# Patient Record
Sex: Male | Born: 1990 | Race: Black or African American | Hispanic: No | Marital: Single | Smoking: Current every day smoker
Health system: Southern US, Community
[De-identification: ages and names within clinical notes are randomized; demographics above are authoritative.]

## PROBLEM LIST (undated history)

## (undated) DIAGNOSIS — W3400XA Accidental discharge from unspecified firearms or gun, initial encounter: Secondary | ICD-10-CM

---
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
## 2015-03-30 ENCOUNTER — Emergency Department (HOSPITAL_COMMUNITY)
Admission: EM | Admit: 2015-03-30 | Discharge: 2015-03-30 | Disposition: A | Discharge status: Home / Self Care | Payer: Self-pay | Attending: Emergency Medicine | Admitting: Emergency Medicine

## 2015-03-30 ENCOUNTER — Emergency Department (HOSPITAL_COMMUNITY): Payer: Self-pay

## 2015-03-30 ENCOUNTER — Encounter (HOSPITAL_COMMUNITY): Payer: Self-pay | Admitting: Emergency Medicine

## 2015-03-30 DIAGNOSIS — S81801A Unspecified open wound, right lower leg, initial encounter: Secondary | ICD-10-CM | POA: Insufficient documentation

## 2015-03-30 DIAGNOSIS — S81802A Unspecified open wound, left lower leg, initial encounter: Secondary | ICD-10-CM | POA: Insufficient documentation

## 2015-03-30 DIAGNOSIS — Y9389 Activity, other specified: Secondary | ICD-10-CM | POA: Insufficient documentation

## 2015-03-30 DIAGNOSIS — Y998 Other external cause status: Secondary | ICD-10-CM | POA: Insufficient documentation

## 2015-03-30 DIAGNOSIS — T1490XA Injury, unspecified, initial encounter: Secondary | ICD-10-CM

## 2015-03-30 DIAGNOSIS — Y9289 Other specified places as the place of occurrence of the external cause: Secondary | ICD-10-CM | POA: Insufficient documentation

## 2015-03-30 DIAGNOSIS — W3400XA Accidental discharge from unspecified firearms or gun, initial encounter: Secondary | ICD-10-CM | POA: Insufficient documentation

## 2015-03-30 DIAGNOSIS — Z23 Encounter for immunization: Secondary | ICD-10-CM | POA: Insufficient documentation

## 2015-03-30 LAB — COMPREHENSIVE METABOLIC PANEL
ALK PHOS: 63 U/L (ref 38–126)
ALT: 12 U/L — AB (ref 17–63)
AST: 24 U/L (ref 15–41)
Albumin: 4.6 g/dL (ref 3.5–5.0)
Anion gap: 20 — ABNORMAL HIGH (ref 5–15)
BILIRUBIN TOTAL: 0.4 mg/dL (ref 0.3–1.2)
BUN: 6 mg/dL (ref 6–20)
CALCIUM: 9.4 mg/dL (ref 8.9–10.3)
CO2: 18 mmol/L — AB (ref 22–32)
CREATININE: 1.08 mg/dL (ref 0.61–1.24)
Chloride: 105 mmol/L (ref 101–111)
Glucose, Bld: 91 mg/dL (ref 65–99)
Potassium: 3.7 mmol/L (ref 3.5–5.1)
SODIUM: 143 mmol/L (ref 135–145)
TOTAL PROTEIN: 7.3 g/dL (ref 6.5–8.1)

## 2015-03-30 LAB — CBC
HCT: 44.6 % (ref 39.0–52.0)
Hemoglobin: 14.9 g/dL (ref 13.0–17.0)
MCH: 28 pg (ref 26.0–34.0)
MCHC: 33.4 g/dL (ref 30.0–36.0)
MCV: 83.7 fL (ref 78.0–100.0)
PLATELETS: 238 10*3/uL (ref 150–400)
RBC: 5.33 MIL/uL (ref 4.22–5.81)
RDW: 13.1 % (ref 11.5–15.5)
WBC: 10.8 10*3/uL — ABNORMAL HIGH (ref 4.0–10.5)

## 2015-03-30 LAB — PROTIME-INR
INR: 1.09 (ref 0.00–1.49)
PROTHROMBIN TIME: 14.3 s (ref 11.6–15.2)

## 2015-03-30 LAB — SAMPLE TO BLOOD BANK

## 2015-03-30 LAB — CDS SEROLOGY

## 2015-03-30 LAB — ETHANOL: ALCOHOL ETHYL (B): 6 mg/dL — AB (ref <5)

## 2015-03-30 MED ORDER — MORPHINE SULFATE (PF) 4 MG/ML IV SOLN
4.0000 mg | Freq: Once | INTRAVENOUS | Status: AC
  Administered 2015-03-30: 4 mg via INTRAVENOUS
  Filled 2015-03-30: qty 1

## 2015-03-30 MED ORDER — TETANUS-DIPHTH-ACELL PERTUSSIS 5-2.5-18.5 LF-MCG/0.5 IM SUSP
0.5000 mL | Freq: Once | INTRAMUSCULAR | Status: AC
  Administered 2015-03-30: 0.5 mL via INTRAMUSCULAR
  Filled 2015-03-30: qty 0.5

## 2015-03-30 MED ORDER — IOHEXOL 350 MG/ML SOLN
100.0000 mL | Freq: Once | INTRAVENOUS | Status: AC | PRN
  Administered 2015-03-30: 100 mL via INTRAVENOUS

## 2015-03-30 MED ORDER — CEPHALEXIN 500 MG PO CAPS
500.0000 mg | ORAL_CAPSULE | Freq: Four times a day (QID) | ORAL | Status: AC
Start: 2015-03-30 — End: ?

## 2015-03-30 MED ORDER — HYDROCODONE-ACETAMINOPHEN 5-325 MG PO TABS: 1.0000 | ORAL_TABLET | Freq: Four times a day (QID) | ORAL | Status: AC | PRN
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
## 2015-03-30 NOTE — ED Notes (Signed)
Patient left at this time with all belongings. Provided crutches.

## 2015-04-05 ENCOUNTER — Emergency Department (HOSPITAL_COMMUNITY)
Admission: EM | Admit: 2015-04-05 | Discharge: 2015-04-05 | Disposition: A | Discharge status: Home / Self Care | Payer: Self-pay | Attending: Emergency Medicine | Admitting: Emergency Medicine

## 2015-04-05 ENCOUNTER — Encounter (HOSPITAL_COMMUNITY): Payer: Self-pay | Admitting: *Deleted

## 2015-04-05 DIAGNOSIS — Z87828 Personal history of other (healed) physical injury and trauma: Secondary | ICD-10-CM | POA: Insufficient documentation

## 2015-04-05 DIAGNOSIS — M795 Residual foreign body in soft tissue: Secondary | ICD-10-CM

## 2015-04-05 DIAGNOSIS — Z792 Long term (current) use of antibiotics: Secondary | ICD-10-CM | POA: Insufficient documentation

## 2015-04-05 DIAGNOSIS — Z1889 Other specified retained foreign body fragments: Secondary | ICD-10-CM | POA: Insufficient documentation

## 2015-04-05 MED ORDER — LIDOCAINE HCL 2 % IJ SOLN: 20.0000 mL | Freq: Once | INTRAMUSCULAR | Status: DC

## 2015-04-05 MED ORDER — LIDOCAINE-EPINEPHRINE (PF) 2 %-1:200000 IJ SOLN
20.0000 mL | Freq: Once | INTRAMUSCULAR | Status: DC
  Filled 2015-04-05: qty 20
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
## 2015-04-05 NOTE — Discharge Instructions (Signed)
Please continue antibiotics as directed. Please follow up in the emergency room in 3 days for re check. Please return sooner if any sings of infection present.
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
## 2015-04-08 ENCOUNTER — Emergency Department (HOSPITAL_COMMUNITY)
Admission: EM | Admit: 2015-04-08 | Discharge: 2015-04-08 | Disposition: A | Discharge status: Home / Self Care | Payer: Self-pay | Attending: Emergency Medicine | Admitting: Emergency Medicine

## 2015-04-08 ENCOUNTER — Encounter (HOSPITAL_COMMUNITY): Payer: Self-pay | Admitting: *Deleted

## 2015-04-08 DIAGNOSIS — Z792 Long term (current) use of antibiotics: Secondary | ICD-10-CM | POA: Insufficient documentation

## 2015-04-08 DIAGNOSIS — F17210 Nicotine dependence, cigarettes, uncomplicated: Secondary | ICD-10-CM | POA: Insufficient documentation

## 2015-04-08 DIAGNOSIS — Z4801 Encounter for change or removal of surgical wound dressing: Secondary | ICD-10-CM | POA: Insufficient documentation

## 2015-04-08 DIAGNOSIS — Z87828 Personal history of other (healed) physical injury and trauma: Secondary | ICD-10-CM | POA: Insufficient documentation

## 2015-04-08 DIAGNOSIS — Z5189 Encounter for other specified aftercare: Secondary | ICD-10-CM

## 2015-04-08 HISTORY — DX: Accidental discharge from unspecified firearms or gun, initial encounter: W34.00XA
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
## 2018-11-30 ENCOUNTER — Other Ambulatory Visit: Payer: Self-pay

## 2018-11-30 ENCOUNTER — Emergency Department (HOSPITAL_COMMUNITY)
Admission: EM | Admit: 2018-11-30 | Discharge: 2018-11-30 | Disposition: A | Discharge status: Home / Self Care | Payer: No Typology Code available for payment source | Attending: Emergency Medicine | Admitting: Emergency Medicine

## 2018-11-30 ENCOUNTER — Emergency Department (HOSPITAL_COMMUNITY): Payer: No Typology Code available for payment source

## 2018-11-30 ENCOUNTER — Encounter (HOSPITAL_COMMUNITY): Payer: Self-pay | Admitting: Emergency Medicine

## 2018-11-30 DIAGNOSIS — F17210 Nicotine dependence, cigarettes, uncomplicated: Secondary | ICD-10-CM | POA: Insufficient documentation

## 2018-11-30 DIAGNOSIS — R0789 Other chest pain: Secondary | ICD-10-CM | POA: Insufficient documentation

## 2018-11-30 DIAGNOSIS — Z79899 Other long term (current) drug therapy: Secondary | ICD-10-CM | POA: Insufficient documentation

## 2018-11-30 DIAGNOSIS — M791 Myalgia, unspecified site: Secondary | ICD-10-CM | POA: Diagnosis not present

## 2018-11-30 MED ORDER — IBUPROFEN 400 MG PO TABS
600.0000 mg | ORAL_TABLET | Freq: Once | ORAL | Status: AC
  Administered 2018-11-30: 600 mg via ORAL
  Filled 2018-11-30: qty 1

## 2018-11-30 MED ORDER — ACETAMINOPHEN 500 MG PO TABS: 500.0000 mg | ORAL_TABLET | Freq: Four times a day (QID) | ORAL | 0 refills | Status: AC | PRN

## 2018-11-30 MED ORDER — CYCLOBENZAPRINE HCL 10 MG PO TABS
10.0000 mg | ORAL_TABLET | Freq: Once | ORAL | Status: AC
  Administered 2018-11-30: 10 mg via ORAL
  Filled 2018-11-30: qty 1

## 2018-11-30 MED ORDER — LIDOCAINE 4 % EX CREA: 1.0000 "application " | TOPICAL_CREAM | Freq: Three times a day (TID) | CUTANEOUS | 0 refills | Status: AC | PRN

## 2018-11-30 MED ORDER — IBUPROFEN 600 MG PO TABS: 600.0000 mg | ORAL_TABLET | Freq: Four times a day (QID) | ORAL | 0 refills | Status: AC | PRN

## 2018-11-30 MED ORDER — CYCLOBENZAPRINE HCL 10 MG PO TABS: 10.0000 mg | ORAL_TABLET | Freq: Two times a day (BID) | ORAL | 0 refills | Status: AC | PRN
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
## 2018-11-30 NOTE — ED Notes (Signed)
Patient verbalizes understanding of discharge instructions . Opportunity for questions and answers were provided . Armband removed by staff ,Pt discharged from ED. W/C  offered at D/C  and Declined W/C at D/C and was escorted to lobby by RN.  
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
## 2018-11-30 NOTE — Discharge Instructions (Signed)
Your x-ray today did not show any sign of broken bones or lung injury.  Alternate 600 mg of ibuprofen and [phone] mg of Tylenol every 3-4 hours as needed for pain.  Take ibuprofen with food to avoid upset stomach issues.  Do not exceed 4000 mg of Tylenol daily.  You can apply lidocaine cream to areas of pain as prescribed.  You may take Flexeril up to twice daily as needed for muscle spasms. This medication may make you drowsy, so I typically only recommended only taking at night. If this medication makes you drowsy throughout the day, no driving, drinking alcohol, or operating heavy machinery. You may also cut these tablets in half if they are too strong.   Apply ice or heat, whichever feels best, to areas of soreness 20 minutes at a time 3-4 times daily.  Do some gentle stretching throughout the day, especially during or after hot showers or baths. Take short frequent walks and avoid prolonged periods of sitting or laying.   Expect to be sore for the next few day and follow up with primary care physician for recheck of ongoing symptoms (especially if symptoms persist longer than 1 week) but return to ER for emergent changing or worsening of symptoms such as severe headache that gets worse, altered mental status/behaving unusually, persistent vomiting, excessive drowsiness, numbness to the arms or legs, unsteady gait, or slurred speech.

## 2020-12-04 ENCOUNTER — Other Ambulatory Visit: Payer: Self-pay

## 2020-12-04 ENCOUNTER — Emergency Department (HOSPITAL_COMMUNITY)
Admission: EM | Admit: 2020-12-04 | Discharge: 2020-12-04 | Disposition: A | Discharge status: Home / Self Care | Payer: Self-pay | Attending: Emergency Medicine | Admitting: Emergency Medicine

## 2020-12-04 ENCOUNTER — Emergency Department (HOSPITAL_COMMUNITY): Payer: Self-pay

## 2020-12-04 DIAGNOSIS — Z20822 Contact with and (suspected) exposure to covid-19: Secondary | ICD-10-CM | POA: Insufficient documentation

## 2020-12-04 DIAGNOSIS — R55 Syncope and collapse: Secondary | ICD-10-CM | POA: Insufficient documentation

## 2020-12-04 DIAGNOSIS — K047 Periapical abscess without sinus: Secondary | ICD-10-CM | POA: Insufficient documentation

## 2020-12-04 DIAGNOSIS — F17210 Nicotine dependence, cigarettes, uncomplicated: Secondary | ICD-10-CM | POA: Insufficient documentation

## 2020-12-04 LAB — BASIC METABOLIC PANEL
Anion gap: 5 (ref 5–15)
BUN: 8 mg/dL (ref 6–20)
CO2: 29 mmol/L (ref 22–32)
Calcium: 8.9 mg/dL (ref 8.9–10.3)
Chloride: 103 mmol/L (ref 98–111)
Creatinine, Ser: 1.04 mg/dL (ref 0.61–1.24)
GFR, Estimated: >60 mL/min (ref >60)
Glucose, Bld: 108 mg/dL — ABNORMAL HIGH (ref 70–99)
Potassium: 3.7 mmol/L (ref 3.5–5.1)
Sodium: 137 mmol/L (ref 135–145)

## 2020-12-04 LAB — CBG MONITORING, ED: Glucose-Capillary: 113 mg/dL — ABNORMAL HIGH (ref 70–99)

## 2020-12-04 LAB — CBC
HCT: 43 % (ref 39.0–52.0)
Hemoglobin: 14.2 g/dL (ref 13.0–17.0)
MCH: 27.9 pg (ref 26.0–34.0)
MCHC: 33 g/dL (ref 30.0–36.0)
MCV: 84.5 fL (ref 80.0–100.0)
Platelets: 269 10*3/uL (ref 150–400)
RBC: 5.09 MIL/uL (ref 4.22–5.81)
RDW: 13.2 % (ref 11.5–15.5)
WBC: 7.6 10*3/uL (ref 4.0–10.5)
nRBC: 0 % (ref 0.0–0.2)

## 2020-12-04 LAB — RESP PANEL BY RT-PCR (FLU A&B, COVID) ARPGX2
Influenza A by PCR: NEGATIVE
Influenza B by PCR: NEGATIVE
SARS Coronavirus 2 by RT PCR: NEGATIVE

## 2020-12-04 MED ORDER — AMOXICILLIN-POT CLAVULANATE 875-125 MG PO TABS: 1.0000 | ORAL_TABLET | Freq: Two times a day (BID) | ORAL | 0 refills | Status: AC

## 2020-12-04 MED ORDER — AMOXICILLIN-POT CLAVULANATE 875-125 MG PO TABS
1.0000 | ORAL_TABLET | Freq: Once | ORAL | Status: AC
  Administered 2020-12-04: 1 via ORAL
  Filled 2020-12-04: qty 1

## 2020-12-04 MED ORDER — SODIUM CHLORIDE 0.9 % IV BOLUS
1000.0000 mL | Freq: Once | INTRAVENOUS | Status: AC
  Administered 2020-12-04: 1000 mL via INTRAVENOUS
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
## 2020-12-04 NOTE — Discharge Instructions (Addendum)
We did not find anything concerning on labs or imaging that were done today in the emergency department. Please return to the ED if you develop recurrent or worsening symptoms.   I have provided you dental resources. Please set up an appointment for your infected tooth. I have also provided you with information to establish care at [facility] so that you can establish care.   Please take all of your antibiotics until finished!   You may develop abdominal discomfort or diarrhea from the antibiotic.  You may help offset this with probiotics which you can buy or get in yogurt. Do not eat  or take the probiotics until 2 hours after your antibiotic.

## 2022-11-08 ENCOUNTER — Encounter (HOSPITAL_COMMUNITY): Payer: Self-pay | Admitting: Emergency Medicine

## 2022-11-08 DIAGNOSIS — W3400XA Accidental discharge from unspecified firearms or gun, initial encounter: Secondary | ICD-10-CM | POA: Insufficient documentation

## 2022-12-01 DEATH — deceased

## 2023-08-26 IMAGING — CR DG CHEST 2V
3 series · 3 of 3 positions shown · non-contrast
Comparison: November 30, 2018

CLINICAL DATA: Syncope.

EXAM:
CHEST - 2 VIEW

[w chest lat]
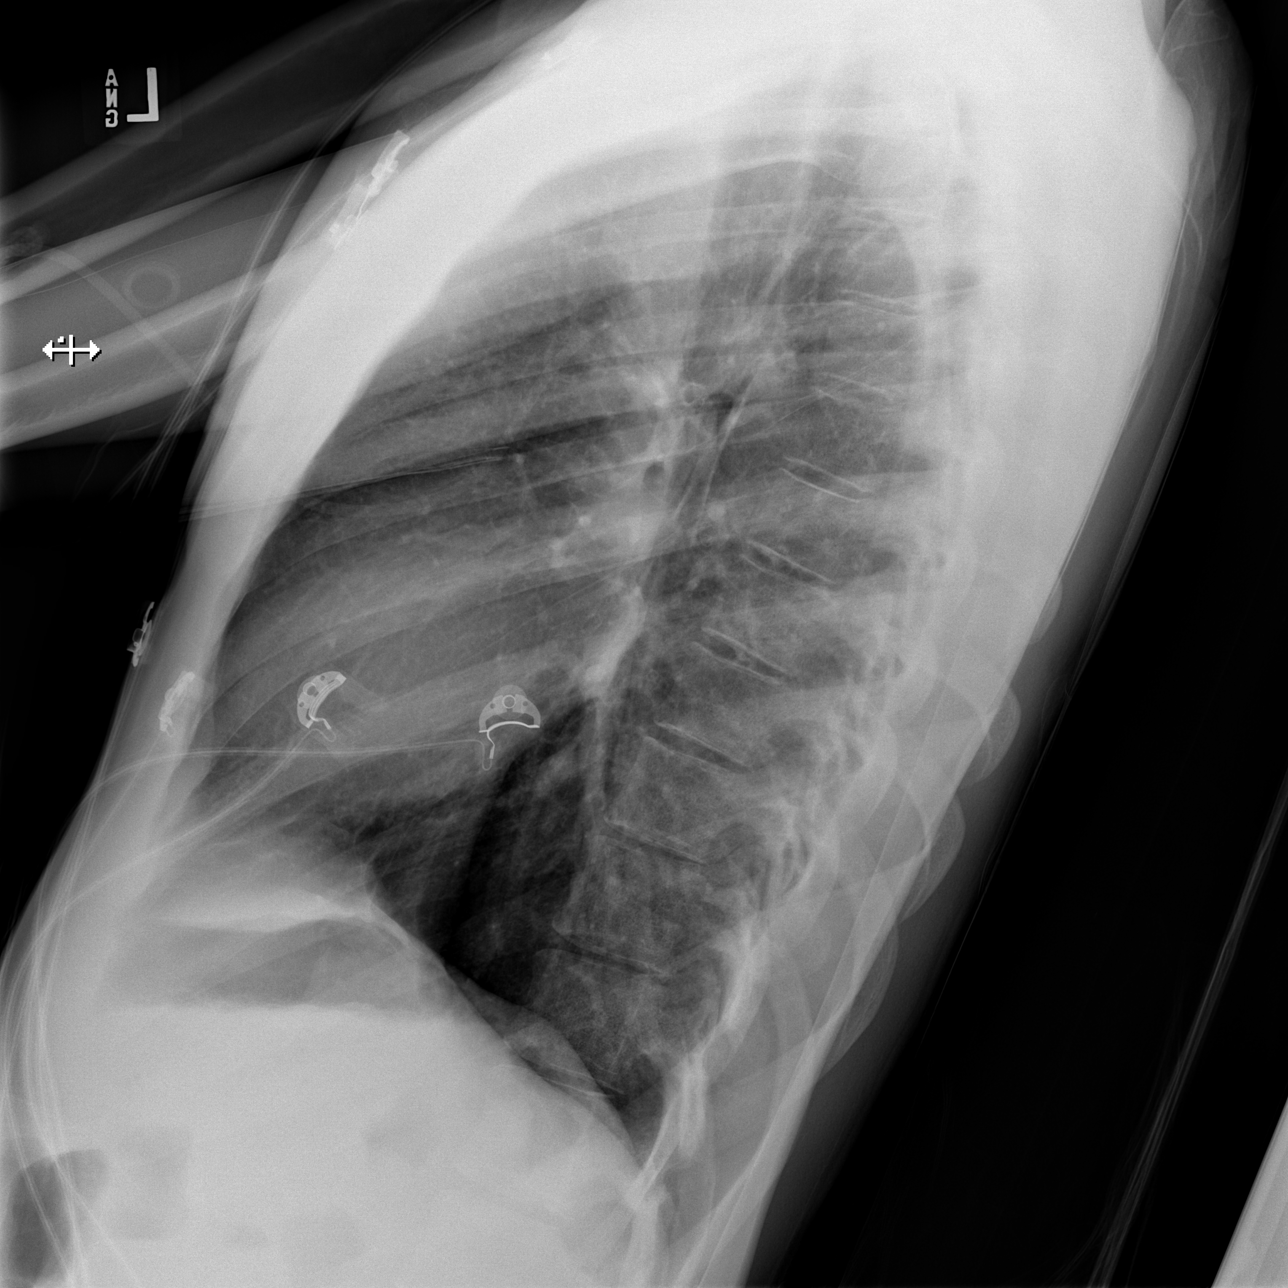

[x chest ap (1 of 2)]
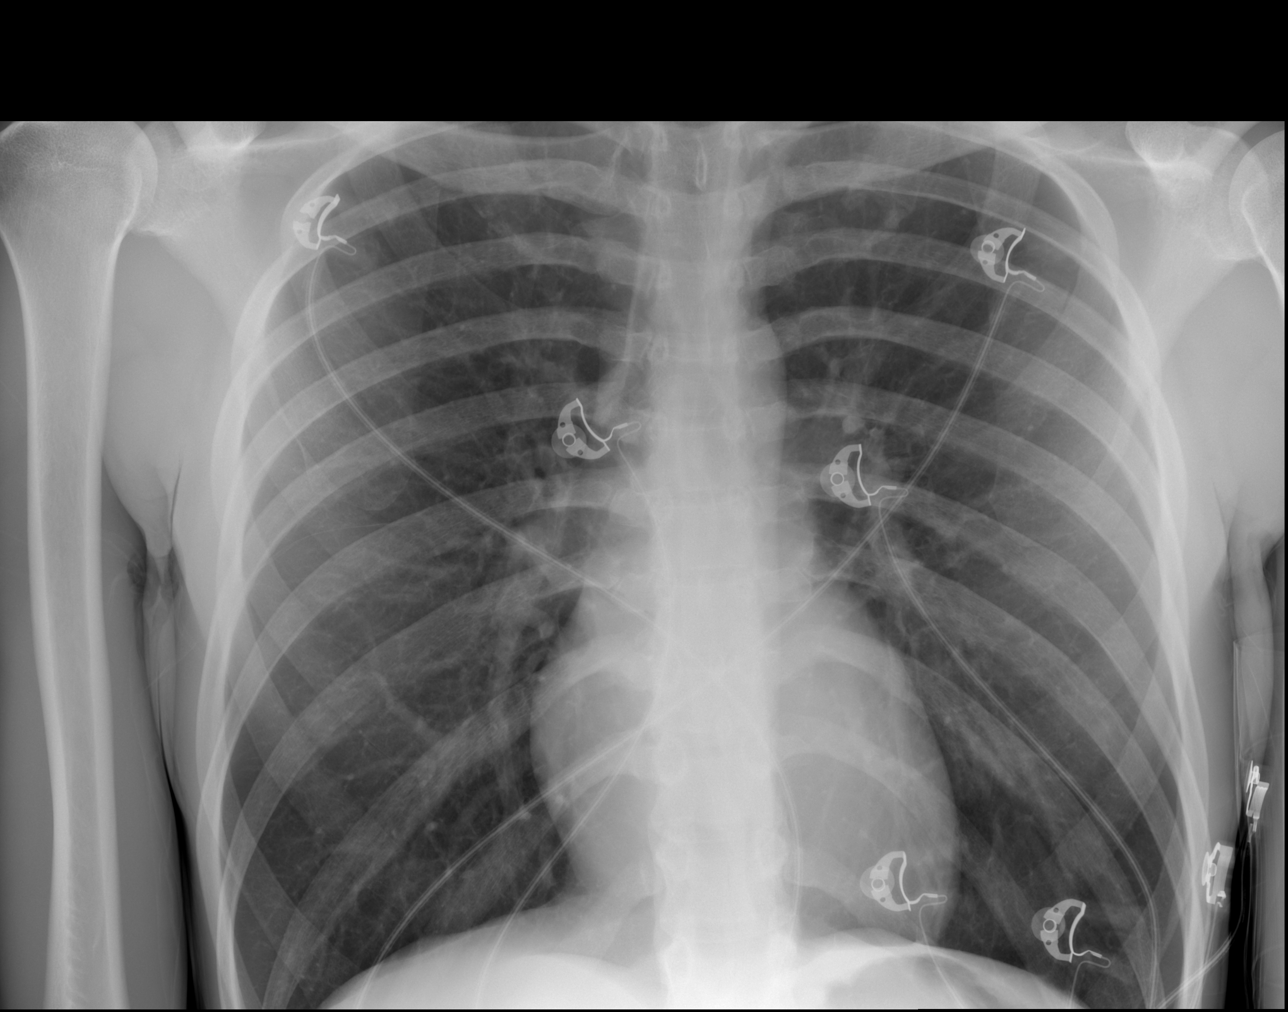

[x chest ap (2 of 2)]
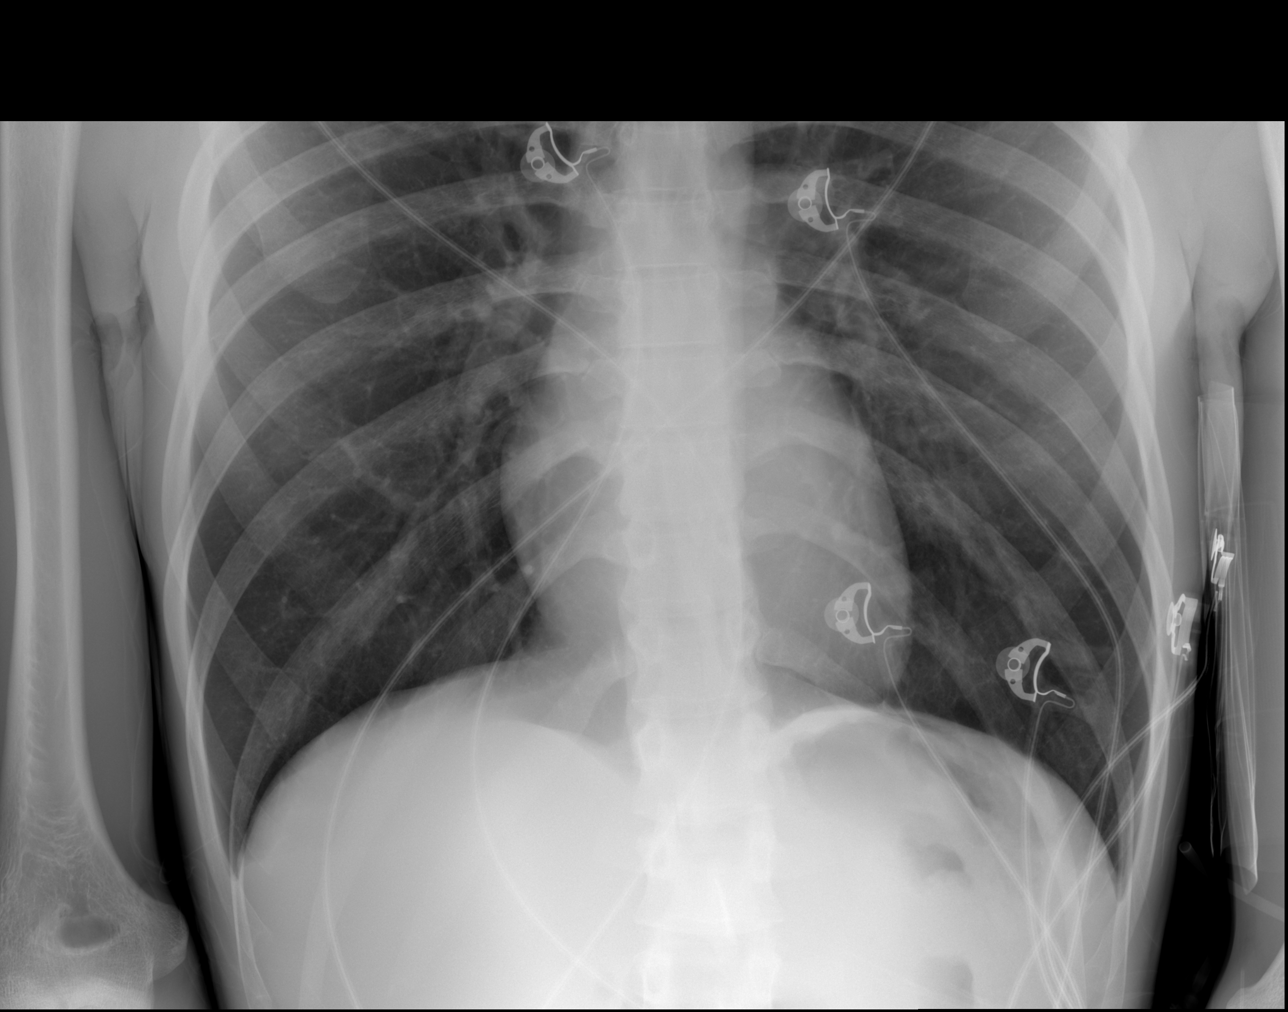

[3 of 3 positions shown; findings below may reference images not displayed]

FINDINGS: The heart size and mediastinal contours are within normal limits.
Both lungs are clear. The visualized skeletal structures are
unremarkable.
IMPRESSION: No active cardiopulmonary disease.
